# Patient Record
Sex: Male | Born: 1985 | Race: White | Hispanic: No | Marital: Married | Smoking: Never smoker
Health system: Southern US, Community
[De-identification: ages and names within clinical notes are randomized; demographics above are authoritative.]

## PROBLEM LIST (undated history)

## (undated) HISTORY — PX: TYMPANOSTOMY TUBE PLACEMENT: SHX32

---
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
## 2012-11-15 ENCOUNTER — Emergency Department (HOSPITAL_COMMUNITY)
Admission: EM | Admit: 2012-11-15 | Discharge: 2012-11-15 | Discharge status: Against Medical Advice | Payer: Self-pay | Attending: Emergency Medicine | Admitting: Emergency Medicine

## 2012-11-15 ENCOUNTER — Encounter (HOSPITAL_COMMUNITY): Payer: Self-pay | Admitting: Emergency Medicine

## 2012-11-15 DIAGNOSIS — IMO0002 Reserved for concepts with insufficient information to code with codable children: Secondary | ICD-10-CM | POA: Insufficient documentation

## 2012-11-15 DIAGNOSIS — Y9389 Activity, other specified: Secondary | ICD-10-CM | POA: Insufficient documentation

## 2012-11-15 DIAGNOSIS — Y929 Unspecified place or not applicable: Secondary | ICD-10-CM | POA: Insufficient documentation

## 2012-11-15 DIAGNOSIS — T189XXA Foreign body of alimentary tract, part unspecified, initial encounter: Secondary | ICD-10-CM | POA: Insufficient documentation

## 2012-11-15 NOTE — ED Notes (Addendum)
Pt states that he has been vomiting recently and he feels like his esophageus is worn out. Pt states that he was chewing steak for his dinner break at work and he did not chew it all the way and he swallowed. Pt coughed for awhile and feels like his throat is still closed, although he vomited and he is able to talk in complete sentences. Pt in no respiratory distress.

## 2012-11-15 NOTE — ED Notes (Signed)
Previous RN first reported pt was debating about leaving. Pt called to do vital signs update and no answer in waiting room.

## 2012-11-15 NOTE — ED Notes (Signed)
Pt stated that he wants to leaver without being seen. Explained to patient the delay in care. Encouraged patient to stay for treatment. Explained to patient the risk if he leaves without being seen by a doctor. Patient is aware. Pt stated that he is drinking water out of the water fountain. Educated patient that he should not until seen by doctor.

## 2013-10-09 ENCOUNTER — Ambulatory Visit (HOSPITAL_COMMUNITY)
Admit: 2013-10-09 | Discharge: 2013-10-09 | Disposition: A | Discharge status: Home / Self Care | Payer: Self-pay | Source: Ambulatory Visit | Attending: Family Medicine | Admitting: Family Medicine

## 2013-10-09 ENCOUNTER — Encounter (HOSPITAL_COMMUNITY): Payer: Self-pay | Admitting: Emergency Medicine

## 2013-10-09 ENCOUNTER — Emergency Department (INDEPENDENT_AMBULATORY_CARE_PROVIDER_SITE_OTHER)
Admission: EM | Admit: 2013-10-09 | Discharge: 2013-10-09 | Disposition: A | Discharge status: Home / Self Care | Payer: Self-pay | Source: Home / Self Care | Attending: Emergency Medicine | Admitting: Emergency Medicine

## 2013-10-09 DIAGNOSIS — R0989 Other specified symptoms and signs involving the circulatory and respiratory systems: Secondary | ICD-10-CM | POA: Insufficient documentation

## 2013-10-09 DIAGNOSIS — R0602 Shortness of breath: Secondary | ICD-10-CM | POA: Insufficient documentation

## 2013-10-09 DIAGNOSIS — R05 Cough: Secondary | ICD-10-CM | POA: Insufficient documentation

## 2013-10-09 DIAGNOSIS — J069 Acute upper respiratory infection, unspecified: Secondary | ICD-10-CM

## 2013-10-09 MED ORDER — METHYLPREDNISOLONE SODIUM SUCC 125 MG IJ SOLR
80.0000 mg | Freq: Once | INTRAMUSCULAR | Status: AC
  Administered 2013-10-09: 80 mg via INTRAMUSCULAR

## 2013-10-09 MED ORDER — IPRATROPIUM-ALBUTEROL 0.5-2.5 (3) MG/3ML IN SOLN
3.0000 mL | Freq: Once | RESPIRATORY_TRACT | Status: AC
  Administered 2013-10-09: 3 mL via RESPIRATORY_TRACT

## 2013-10-09 MED ORDER — IPRATROPIUM-ALBUTEROL 0.5-2.5 (3) MG/3ML IN SOLN
RESPIRATORY_TRACT | Status: AC
  Filled 2013-10-09: qty 3

## 2013-10-09 MED ORDER — ALBUTEROL SULFATE HFA 108 (90 BASE) MCG/ACT IN AERS: 2.0000 | INHALATION_SPRAY | RESPIRATORY_TRACT | Status: AC | PRN

## 2013-10-09 MED ORDER — METHYLPREDNISOLONE SODIUM SUCC 125 MG IJ SOLR
INTRAMUSCULAR | Status: AC
  Filled 2013-10-09: qty 2

## 2013-10-09 NOTE — Discharge Instructions (Signed)
You have a viral infection that is causing inflammation in your airways. The chest x-ray was negative for pneumonia.  Use the albuterol inhaler as needed. You should start to feel better in the next 3-4 days.  Please come back if your symptoms worsen or change.

## 2013-10-09 NOTE — ED Notes (Signed)
C/o  Sob.  Fever.  Not able to lay flat when sleeping.  Nonproductive cough.  No otc meds taken.  Symptoms present x 1 wk.

## 2013-10-09 NOTE — ED Provider Notes (Signed)
Patient signed out to me by Dr. Konrad DoloresMerrell.  He is a 28 year old man presenting with shortness of breath and fever for 4 days.  Chest x-ray reviewed and negative for pneumonia.  He received a DuoNeb and Solu-Medrol IM. After the DuoNeb he reports improvement in his shortness of breath. Lung exam is also improved with decreased rhonchi.  Will discharge home with albuterol inhaler to use as needed. Warning signs to return reviewed.  Charm RingsErin J Sharlize Hoar, MD 10/09/13 [phone]

## 2013-10-09 NOTE — ED Provider Notes (Signed)
CSN: [id number]     Arrival date & time 10/09/13  1600 History   None    Chief Complaint  Patient presents with  . Shortness of Breath  . Fever   (Consider location/radiation/quality/duration/timing/severity/associated sxs/prior Treatment) HPI  SOB: started 4 days ago. Exercises regularly. Worse w/ deep rapid breathing. Decreased activity level. Associated w/ coughing. Unable to sleep on back . Associated fever adn congestion.  Tylenol for fever w/ benefit.   History reviewed. No pertinent past medical history. History reviewed. No pertinent past surgical history. History reviewed. No pertinent family history. History  Substance Use Topics  . Smoking status: Never Smoker   . Smokeless tobacco: Not on file  . Alcohol Use: Yes    Review of Systems Per HPI with all other pertinent systems negative.   Allergies  Ibuprofen  Home Medications   Prior to Admission medications   Not on File   BP 129/83  Pulse 63  Temp(Src) 98.3 F (36.8 C) (Oral)  Resp 16  SpO2 100% Physical Exam  Constitutional: He is oriented to person, place, and time. He appears well-developed and well-nourished.  Ill appearing  HENT:  Head: Normocephalic and atraumatic.  Mouth/Throat: Oropharynx is clear and moist.  Pharyngeal cobblestoning  Eyes: EOM are normal. Pupils are equal, round, and reactive to light.  Cardiovascular: Normal rate, normal heart sounds and intact distal pulses.  Exam reveals no gallop.   No murmur heard. Pulmonary/Chest: Effort normal.  Course breath sound bilaterall w/ R>L. Bilat end expiratory wheezing Actively coughing  Abdominal: Soft.  Musculoskeletal: Normal range of motion. He exhibits no edema and no tenderness.  Neurological: He is alert and oriented to person, place, and time.  Skin: Skin is warm and dry. No rash noted. He is not diaphoretic. No erythema. No pallor.  Psychiatric: He has a normal mood and affect. His behavior is normal. Judgment and thought  content normal.    ED Course  Procedures (including critical care time) Labs Review Labs Reviewed - No data to display  Imaging Review No results found.   MDM  No diagnosis found.   Strong suspicion for pneumonia vs bronchospasm from PND URI. CXR 2 view. IF negative then SOlumedrol 125mg  IM x1 and duoneb. OR THERAPY AS DEEMED APPROPRIATE BY DR HONIG  Pt signed out to Dr. Piedad ClimesHonig who will complete pts care   Shelly Flattenavid Merrell, MD Family Medicine 10/09/2013, 4:50 PM      Ozella Rocksavid J Merrell, MD 10/09/13 1650

## 2015-05-09 IMAGING — CR DG CHEST 2V
2 series · 2 of 2 positions shown · non-contrast
Comparison: None.

CLINICAL DATA: One week history of productive cough, congestion,
and shortness of breath; initial visit

EXAM:
CHEST  2 VIEW

[w chest pa]
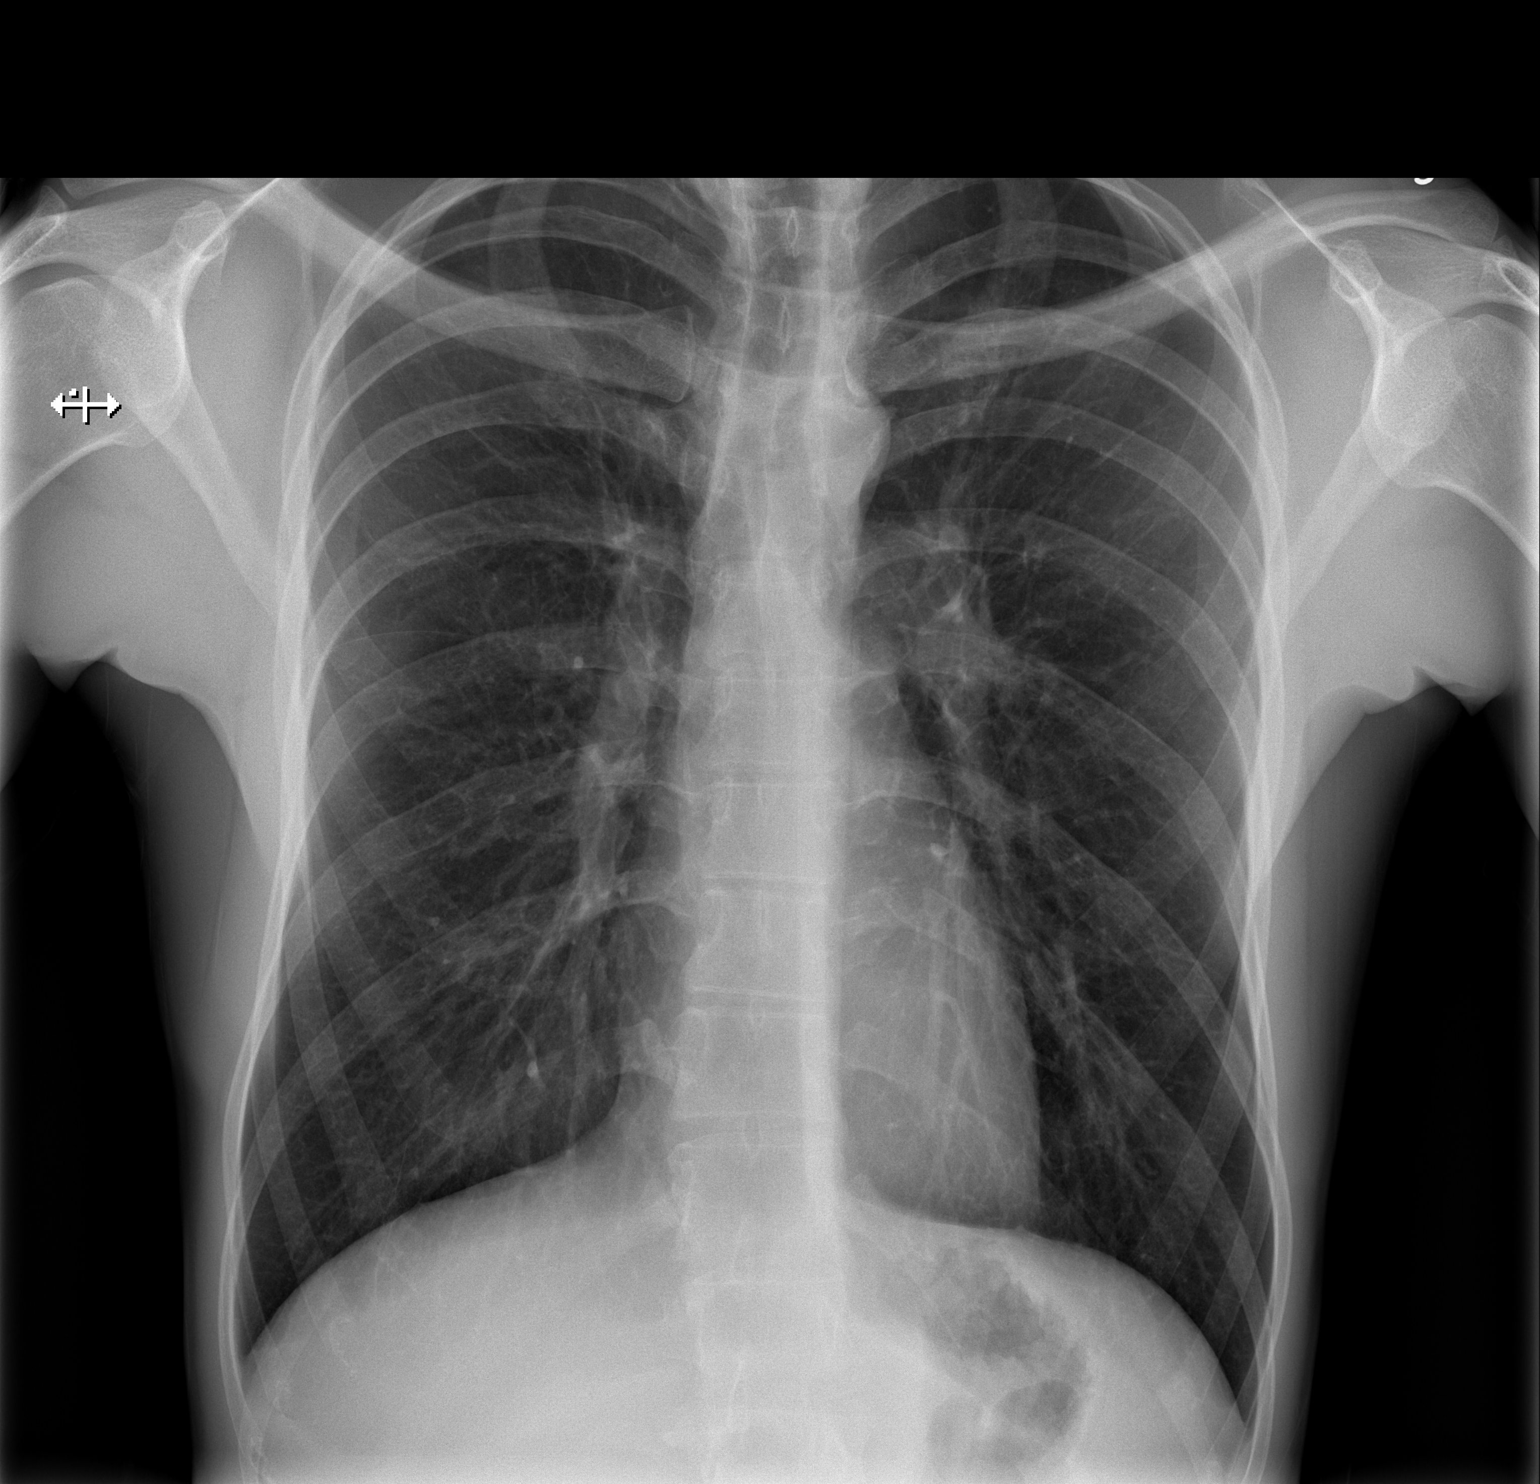

[w chest lat]
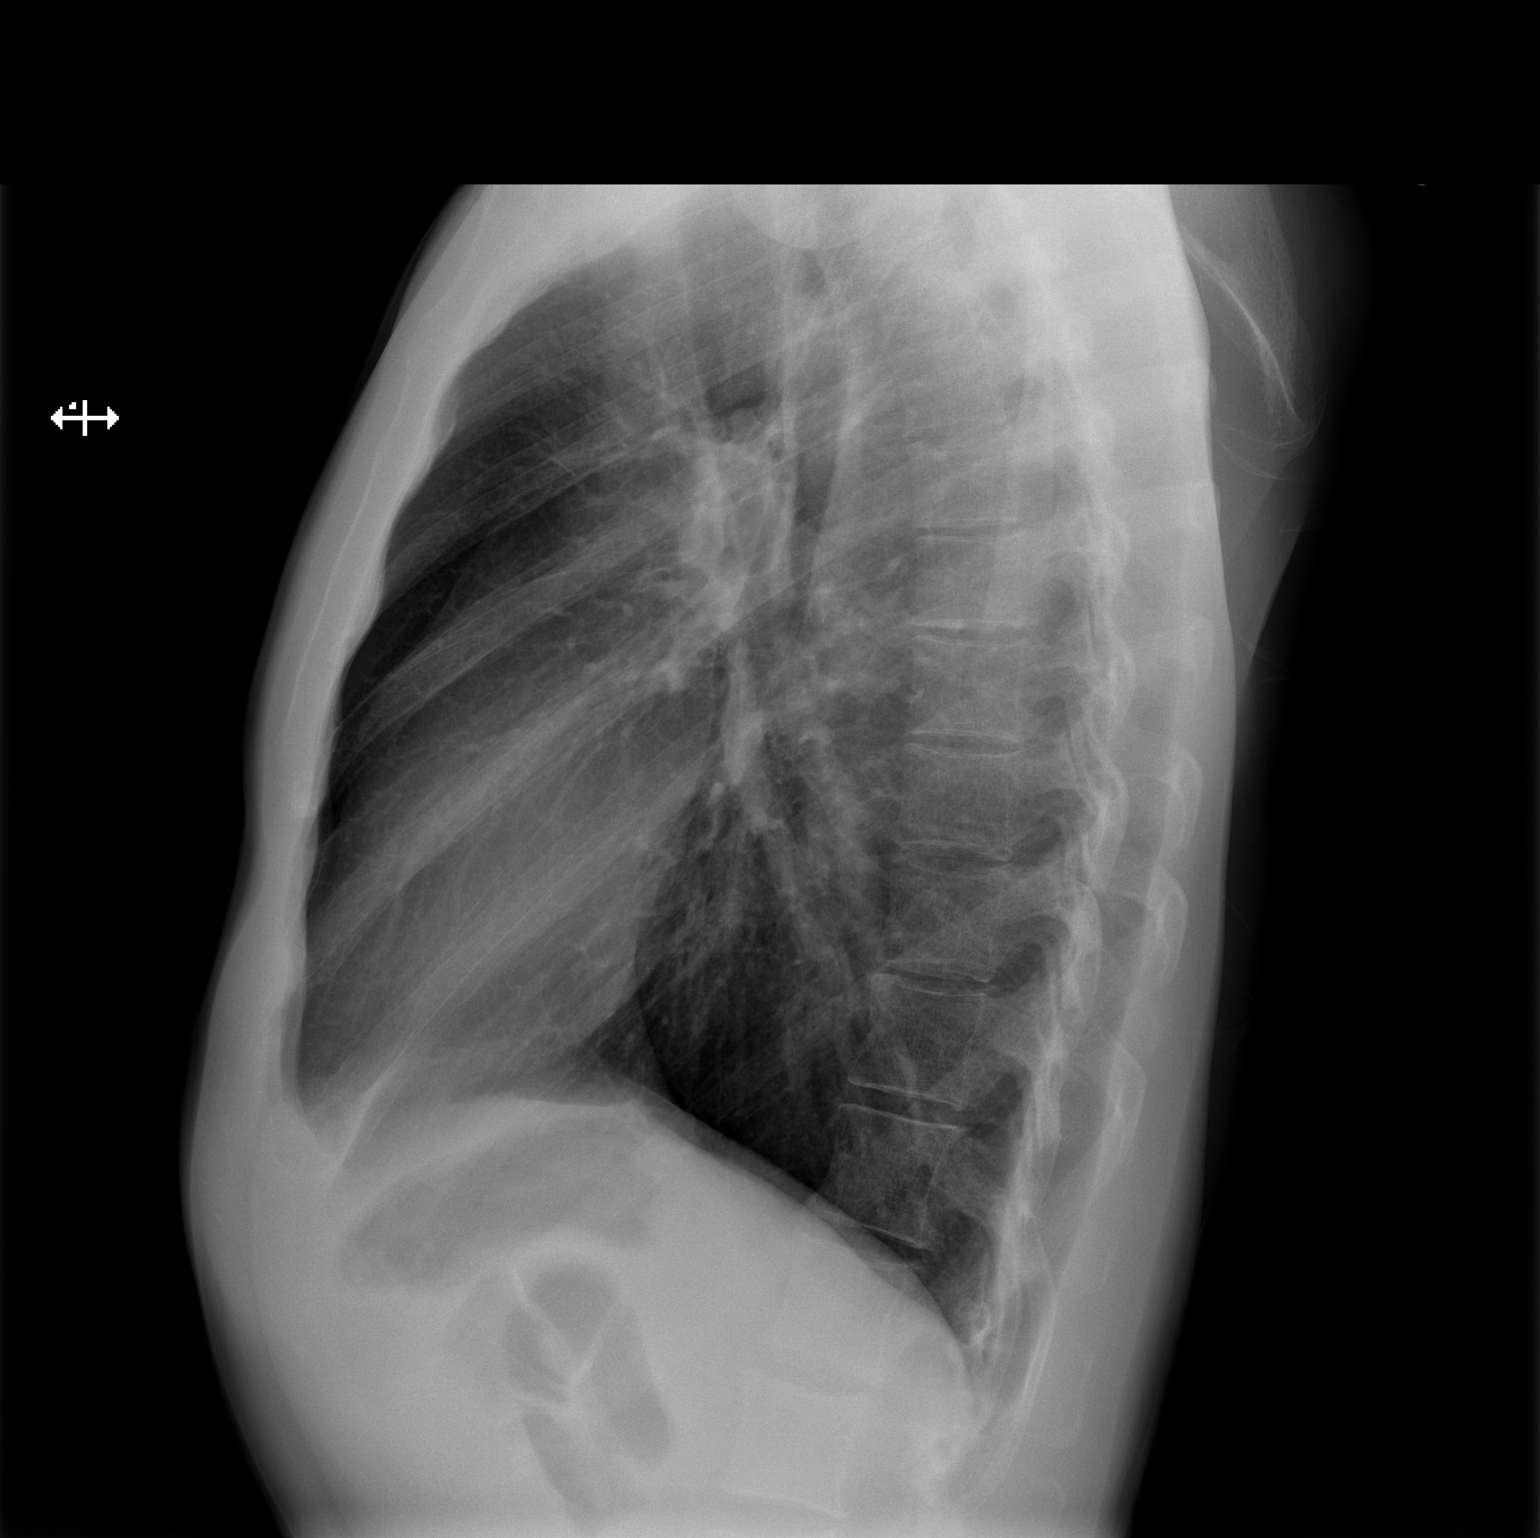

[2 of 2 positions shown; findings below may reference images not displayed]

FINDINGS: The lungs are mildly hyperinflated but clear. There is no
pneumothorax or pleural effusion. The heart and pulmonary
vascularity are normal. The mediastinum is normal in width. The bony
thorax is unremarkable.
IMPRESSION: There is no evidence of pneumonia nor other acute cardiopulmonary
abnormality. Mild hyperinflation may be voluntary or could reflect
underlying reactive airway disease.

## 2018-10-09 ENCOUNTER — Emergency Department (HOSPITAL_BASED_OUTPATIENT_CLINIC_OR_DEPARTMENT_OTHER): Payer: Self-pay

## 2018-10-09 ENCOUNTER — Other Ambulatory Visit: Payer: Self-pay

## 2018-10-09 ENCOUNTER — Encounter (HOSPITAL_BASED_OUTPATIENT_CLINIC_OR_DEPARTMENT_OTHER): Payer: Self-pay | Admitting: *Deleted

## 2018-10-09 ENCOUNTER — Emergency Department (HOSPITAL_BASED_OUTPATIENT_CLINIC_OR_DEPARTMENT_OTHER)
Admission: EM | Admit: 2018-10-09 | Discharge: 2018-10-09 | Disposition: A | Discharge status: Home / Self Care | Payer: Self-pay | Attending: Emergency Medicine | Admitting: Emergency Medicine

## 2018-10-09 DIAGNOSIS — R0789 Other chest pain: Secondary | ICD-10-CM | POA: Insufficient documentation

## 2018-10-09 DIAGNOSIS — Z87891 Personal history of nicotine dependence: Secondary | ICD-10-CM | POA: Insufficient documentation

## 2018-10-09 LAB — CBC WITH DIFFERENTIAL/PLATELET
Abs Immature Granulocytes: 0.02 10*3/uL (ref 0.00–0.07)
Basophils Absolute: 0 10*3/uL (ref 0.0–0.1)
Basophils Relative: 0 %
Eosinophils Absolute: 0.1 10*3/uL (ref 0.0–0.5)
Eosinophils Relative: 1 %
HCT: 46.9 % (ref 39.0–52.0)
Hemoglobin: 16 g/dL (ref 13.0–17.0)
Immature Granulocytes: 0 %
Lymphocytes Relative: 27 %
Lymphs Abs: 2.3 10*3/uL (ref 0.7–4.0)
MCH: 30.5 pg (ref 26.0–34.0)
MCHC: 34.1 g/dL (ref 30.0–36.0)
MCV: 89.5 fL (ref 80.0–100.0)
Monocytes Absolute: 0.6 10*3/uL (ref 0.1–1.0)
Monocytes Relative: 7 %
Neutro Abs: 5.6 10*3/uL (ref 1.7–7.7)
Neutrophils Relative %: 65 %
Platelets: 180 10*3/uL (ref 150–400)
RBC: 5.24 MIL/uL (ref 4.22–5.81)
RDW: 11.8 % (ref 11.5–15.5)
WBC: 8.6 10*3/uL (ref 4.0–10.5)
nRBC: 0 % (ref 0.0–0.2)

## 2018-10-09 LAB — COMPREHENSIVE METABOLIC PANEL
ALT: 20 U/L (ref 0–44)
AST: 17 U/L (ref 15–41)
Albumin: 4.9 g/dL (ref 3.5–5.0)
Alkaline Phosphatase: 63 U/L (ref 38–126)
Anion gap: 11 (ref 5–15)
BUN: 17 mg/dL (ref 6–20)
CO2: 24 mmol/L (ref 22–32)
Calcium: 9.7 mg/dL (ref 8.9–10.3)
Chloride: 102 mmol/L (ref 98–111)
Creatinine, Ser: 0.9 mg/dL (ref 0.61–1.24)
GFR calc Af Amer: >60 mL/min (ref >60)
GFR calc non Af Amer: >60 mL/min (ref >60)
Glucose, Bld: 100 mg/dL — ABNORMAL HIGH (ref 70–99)
Potassium: 3.9 mmol/L (ref 3.5–5.1)
Sodium: 137 mmol/L (ref 135–145)
Total Bilirubin: 0.7 mg/dL (ref 0.3–1.2)
Total Protein: 8.1 g/dL (ref 6.5–8.1)

## 2018-10-09 LAB — D-DIMER, QUANTITATIVE: D-Dimer, Quant: <0.27 ug/mL-FEU (ref 0.00–0.50)

## 2018-10-09 LAB — APTT: aPTT: 25 seconds (ref 24–36)

## 2018-10-09 LAB — PROTIME-INR
INR: 1 (ref 0.8–1.2)
Prothrombin Time: 13.1 seconds (ref 11.4–15.2)

## 2018-10-09 LAB — TROPONIN I (HIGH SENSITIVITY): Troponin I (High Sensitivity): <2 ng/L (ref <18)

## 2018-10-09 MED ORDER — ACETAMINOPHEN 325 MG PO TABS: 650.0000 mg | ORAL_TABLET | Freq: Four times a day (QID) | ORAL | 0 refills | Status: AC | PRN

## 2018-10-09 NOTE — Discharge Instructions (Signed)
Please follow up with your primary care provider within 5-7 days for re-evaluation of your symptoms. If you do not have a primary care provider, information for a healthcare clinic has been provided for you to make arrangements for follow up care. Please return to the emergency department for any new or worsening symptoms. ° °

## 2018-10-09 NOTE — ED Provider Notes (Addendum)
[facility] EMERGENCY DEPARTMENT Provider Note   CSN: [id number] Arrival date & time: 10/09/18  1449     History   Chief Complaint Chief Complaint  Patient presents with  . Chest Pain    HPI Luis Newton is a 33 y.o. male.     HPI   Pt is a 33 y/o male who presents to the ED today for eval of chest pain.  Patient states that he has had chest pain intermittently for the last month.  It is located to the left side of his chest.  He describes it as a dull ache just inferior to his nipple.  He is also had some nipple pain and some pain to the left axilla.  The left axillary pain is worse with palpation however his chest is not.  He states symptoms have seemed to improve over the course of a month.  He has tried several life changes to help with the pain including starting to eat healthier, quitting smoking tobacco.  He also works out 5 to 7 days/week.  He states his pain actually feels better when he works out he can run several miles at a time without having any chest pain.  He does note that he takes many over-the-counter supplements.  When his chest pain for started he was taking a medication called LGB-[id number] which is a supplement for muscle growth.  He noticed that his chest pain started after taking this medication so he stopped taking it.  He notes he takes many other supplements including L-arginine, L thiamine, "brain booster ", "cats claw ", ginger root extract, ginkgo.  He also drinks at least 1 pot of green tea per day.  Sometimes he drinks several pots of this.  He notes he has had associated random areas of bruising that are atraumatic.  He has mild associated shortness of breath that seems intermittent.  Denies any significant cough or pain with inspiration.  Denies any fevers but did note that he has been having night sweats intermittently.  Denies leg pain/swelling, hemoptysis, recent surgery, recent long travel, hormone use, personal hx of cancer, or hx of DVT/PE.  Denies h/o HTN, HLD, DM.  Denies any early family history of heart disease.   History reviewed. No pertinent past medical history.  There are no active problems to display for this patient.   Past Surgical History:  Procedure Laterality Date  . TYMPANOSTOMY TUBE PLACEMENT        Home Medications    Prior to Admission medications   Medication Sig Start Date End Date Taking? Authorizing Provider  acetaminophen (TYLENOL) 325 MG tablet Take 2 tablets (650 mg total) by mouth every 6 (six) hours as needed for up to 5 days. Do not take more than 4000mg  of tylenol per day 10/09/18 10/14/18  Harrison Paulson S, PA-C  albuterol (PROVENTIL HFA;VENTOLIN HFA) 108 (90 BASE) MCG/ACT inhaler Inhale 2 puffs into the lungs every 4 (four) hours as needed for wheezing or shortness of breath. 10/09/13   Melony Overly, MD    Family History No family history on file.  Social History Social History   Tobacco Use  . Smoking status: Never Smoker  Substance Use Topics  . Alcohol use: Yes  . Drug use: Yes    Types: Marijuana     Allergies   Ibuprofen   Review of Systems Review of Systems  Constitutional: Positive for diaphoresis. Negative for fever.  HENT: Negative for ear pain and sore throat.  Eyes: Negative for pain and visual disturbance.  Respiratory: Positive for shortness of breath (mild). Negative for cough.   Cardiovascular: Positive for chest pain. Negative for palpitations and leg swelling.  Gastrointestinal: Negative for abdominal pain, constipation, diarrhea, nausea and vomiting.  Genitourinary: Negative for dysuria and hematuria.  Musculoskeletal: Negative for back pain.  Skin: Negative for color change and rash.  Neurological: Negative for headaches.  All other systems reviewed and are negative.    Physical Exam Updated Vital Signs BP (!) 148/84 (BP Location: Left Arm)   Pulse 74   Temp 98.5 F (36.9 C)   Resp 18   Ht 6\' 1"  (1.854 m)   Wt 70.3 kg   SpO2 99%   BMI  20.45 kg/m   Physical Exam Vitals signs and nursing note reviewed.  Constitutional:      Appearance: He is well-developed.  HENT:     Head: Normocephalic and atraumatic.  Eyes:     Conjunctiva/sclera: Conjunctivae normal.  Neck:     Musculoskeletal: Neck supple.  Cardiovascular:     Rate and Rhythm: Normal rate and regular rhythm.     Heart sounds: No murmur.  Pulmonary:     Effort: Pulmonary effort is normal. No respiratory distress.     Breath sounds: Normal breath sounds. No decreased breath sounds, wheezing, rhonchi or rales.  Chest:     Chest wall: No mass or tenderness.  Abdominal:     General: Bowel sounds are normal.     Palpations: Abdomen is soft.     Tenderness: There is no abdominal tenderness.  Musculoskeletal:     Right lower leg: No edema.     Left lower leg: No edema.     Comments: Ecchymosis to the right lateral thigh  Skin:    General: Skin is warm and dry.  Neurological:     Mental Status: He is alert.  Psychiatric:     Comments: Appears conscious      ED Treatments / Results  Labs (all labs ordered are listed, but only abnormal results are displayed) Labs Reviewed  COMPREHENSIVE METABOLIC PANEL - Abnormal; Notable for the following components:      Result Value   Glucose, Bld 100 (*)    All other components within normal limits  CBC WITH DIFFERENTIAL/PLATELET  D-DIMER, QUANTITATIVE (NOT AT [facility])  PROTIME-INR  APTT  TROPONIN I (HIGH SENSITIVITY)    EKG EKG Interpretation  Date/Time:  Thursday October 09 2018 15:03:57 EDT Ventricular Rate:  95 PR Interval:    QRS Duration: 92 QT Interval:  344 QTC Calculation: 432 R Axis:   93 Text Interpretation:  sinus  Rightward axis Incomplete right bundle branch block No old tracing to compare Confirmed by 1970 [phone]) on 10/09/2018 3:33:38 PM   Radiology Dg Chest 2 View  Result Date: 10/09/2018 CLINICAL DATA:  Left-sided dull achy chest pain for the past month and a half. No  injury. EXAM: CHEST - 2 VIEW COMPARISON:  Chest x-ray dated October 09, 2013. FINDINGS: The heart size and mediastinal contours are within normal limits. Normal pulmonary vascularity. The lungs remain hyperinflated. No focal consolidation, pleural effusion, or pneumothorax. No acute osseous abnormality. IMPRESSION: No active cardiopulmonary disease. Electronically Signed   By: October 11, 2013 M.D.   On: 10/09/2018 16:38    Procedures Procedures (including critical care time)  Medications Ordered in ED Medications - No data to display   Initial Impression / Assessment and Plan / ED Course  I have reviewed the  triage vital signs and the nursing notes.  Pertinent labs & imaging results that were available during my care of the patient were reviewed by me and considered in my medical decision making (see chart for details).   Final Clinical Impressions(s) / ED Diagnoses   Final diagnoses:  Atypical chest pain   33 year old male with intermittent history of left-sided chest pain ongoing for 1 month.  Slightly hypertensive but otherwise vital signs reassuring.  Patient's exam is also reassuring.  No reproducible tenderness or rashes on exam but lungs are clear bilaterally and heart has regular rate and rhythm.  CBC, CMP within normal limits Troponin negative D-dimer negative Coags are normal  Chest x-ray is negative  EKG with NSR, Rightward axis Incomplete right bundle branch block No old tracing to compare   Symptoms not consistent with ACS, esophageal rupture, aortic dissection, PE, pneumothorax or other acute cardiac/pulmonary cause.  He is well-appearing on exam today.  I do not think that he will require admission given his reassuring work-up.  I will give him cardiology follow-up as well as PCP follow-up.  Have advised him to return to the ED for new or worsening symptoms.  He voices understanding and is in agreement with plan.  All questions answered.  Patient stable for discharge.   ED Discharge Orders         Ordered    acetaminophen (TYLENOL) 325 MG tablet  Every 6 hours PRN     10/09/18 1714           Lilith Solana S, PA-C 10/09/18 2219    Karrie MeresCouture, Avyon Herendeen S, PA-C 10/09/18 2220    Terrilee FilesButler, Michael C, MD 10/09/18 [phone]

## 2018-10-09 NOTE — ED Triage Notes (Addendum)
Pt c/o left chest pain and left axillary pain with palp. X 1 month also new brusing  noted x 2 days and night sweats

## 2020-05-08 IMAGING — CR DG CHEST 2V
2 series · 2 of 2 positions shown · non-contrast
Comparison: Chest x-ray dated October 09, 2013.

CLINICAL DATA: Left-sided dull achy chest pain for the past month
and a half. No injury.

EXAM:
CHEST - 2 VIEW

[w chest pa]
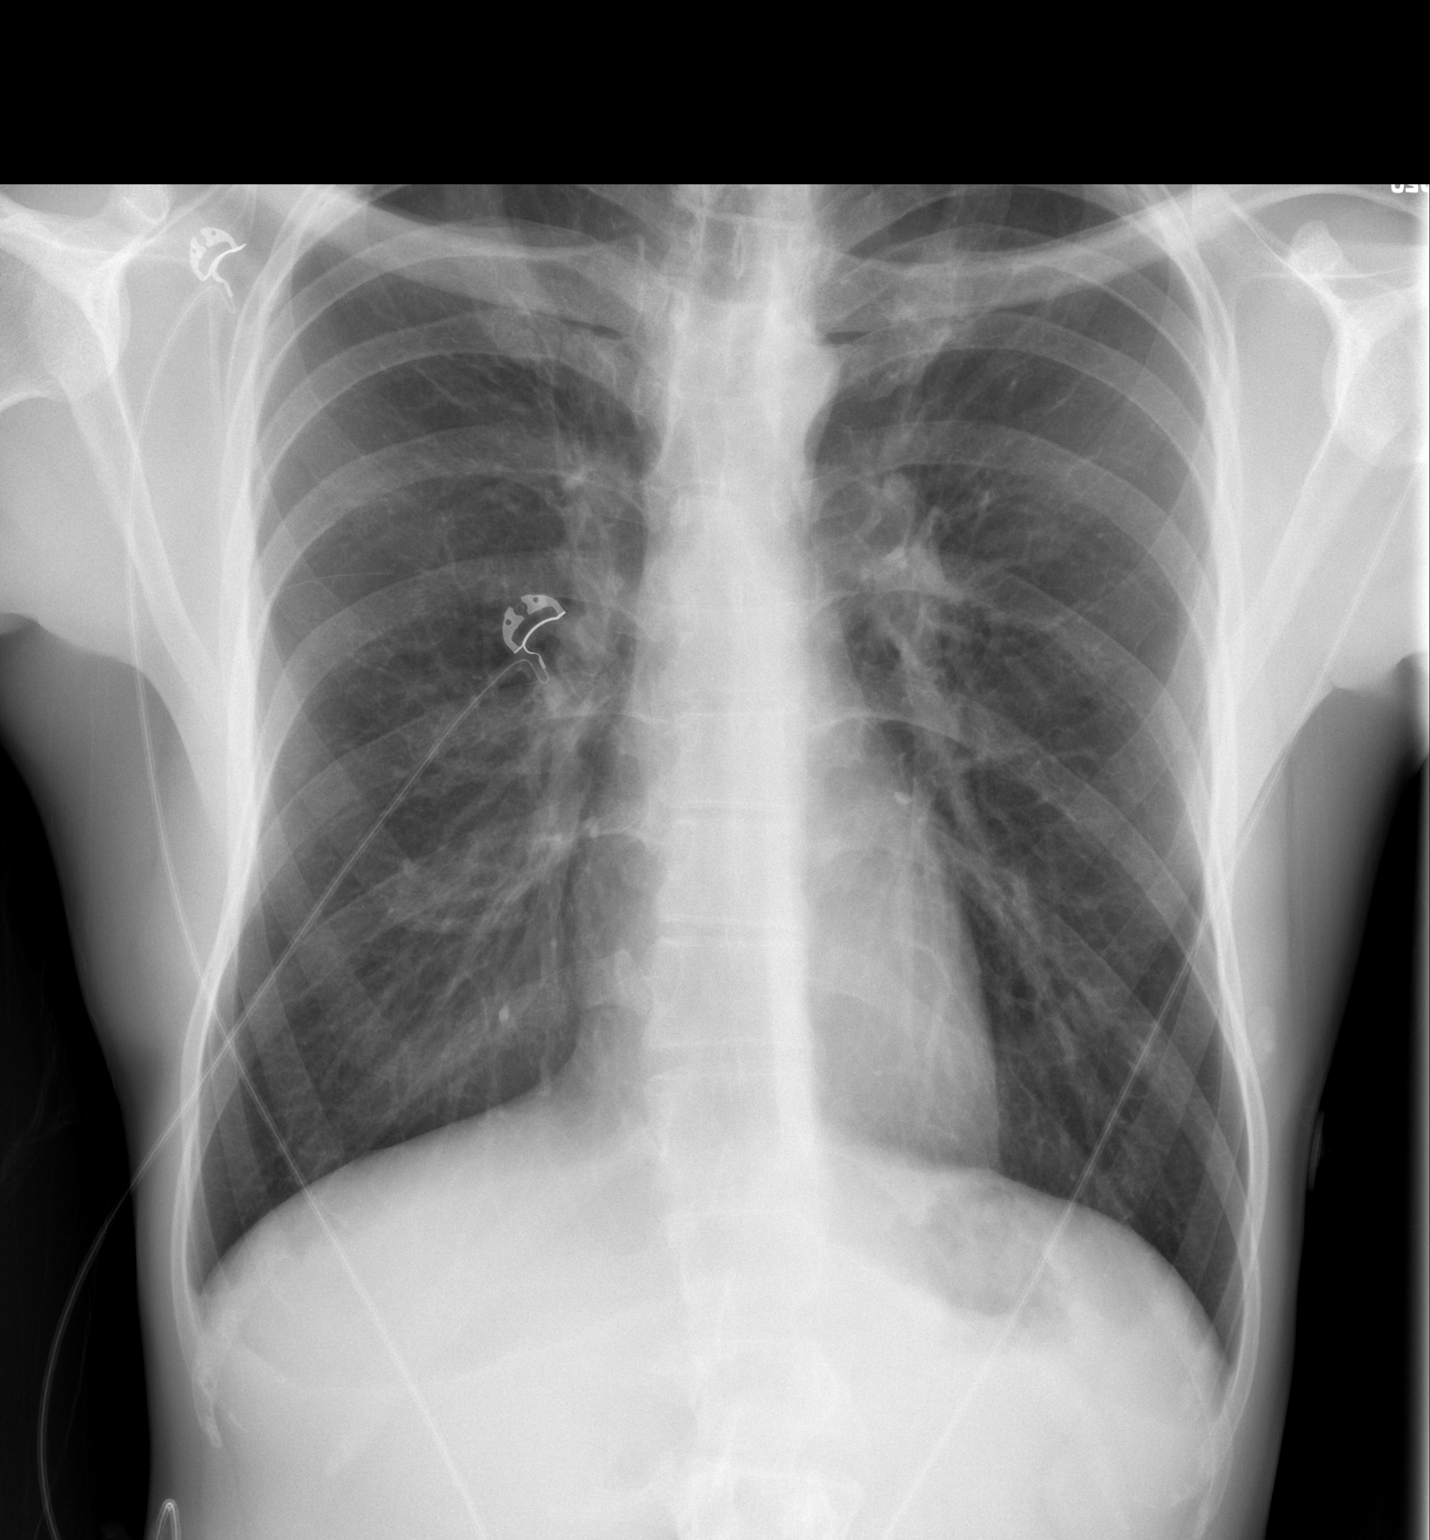

[w chest lat]
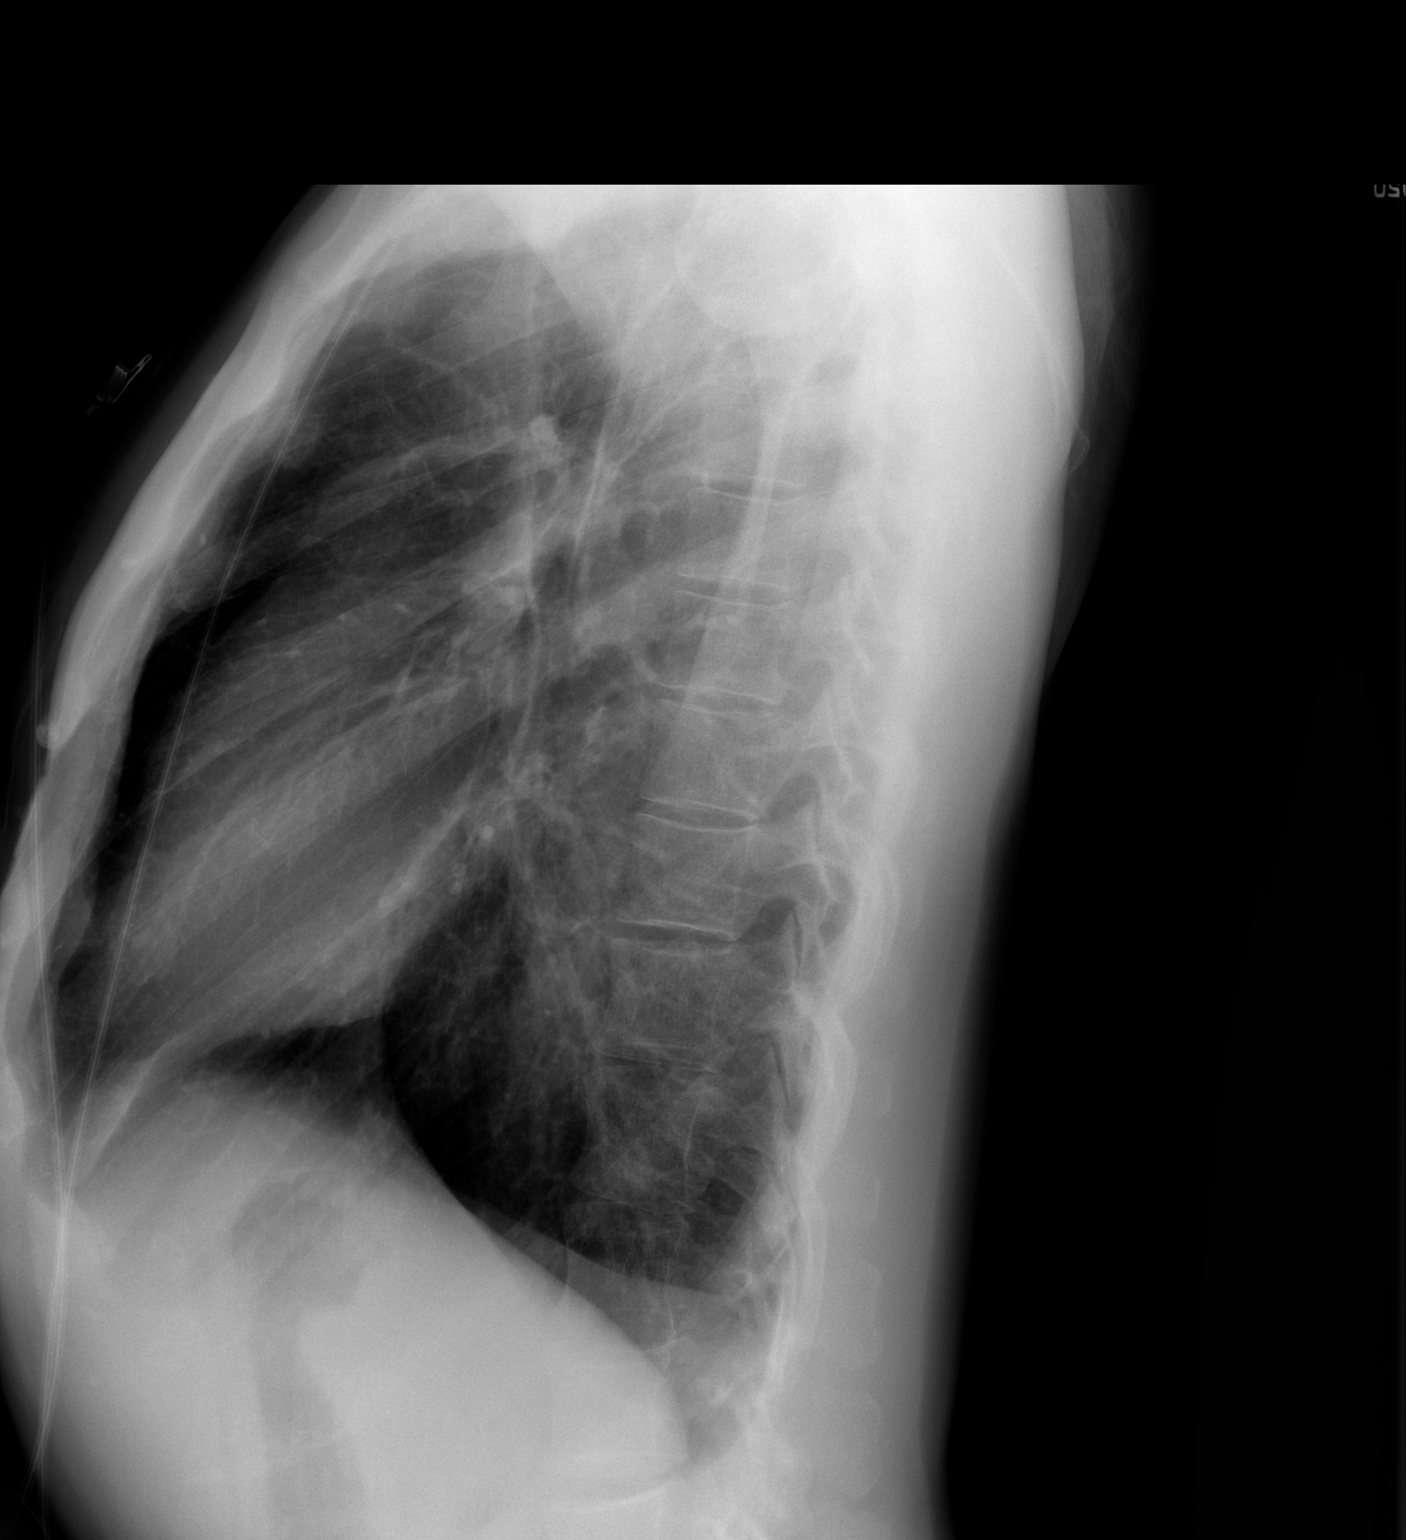

[2 of 2 positions shown; findings below may reference images not displayed]

FINDINGS: The heart size and mediastinal contours are within normal limits.
Normal pulmonary vascularity. The lungs remain hyperinflated. No
focal consolidation, pleural effusion, or pneumothorax. No acute
osseous abnormality.
IMPRESSION: No active cardiopulmonary disease.
